# Patient Record
Sex: Female | Born: 1950 | Race: Black or African American | Hispanic: No | Marital: Married | Smoking: Never smoker
Health system: Southern US, Community
[De-identification: ages and names within clinical notes are randomized; demographics above are authoritative.]

## PROBLEM LIST (undated history)

## (undated) DIAGNOSIS — R42 Dizziness and giddiness: Secondary | ICD-10-CM

## (undated) DIAGNOSIS — I10 Essential (primary) hypertension: Secondary | ICD-10-CM

## (undated) DIAGNOSIS — E0590 Thyrotoxicosis, unspecified without thyrotoxic crisis or storm: Secondary | ICD-10-CM

---

## 1998-08-04 ENCOUNTER — Encounter: Admission: RE | Admit: 1998-08-04 | Discharge: 1998-08-04 | Payer: Self-pay | Admitting: Obstetrics & Gynecology

## 1998-08-20 ENCOUNTER — Ambulatory Visit (HOSPITAL_COMMUNITY): Admission: RE | Admit: 1998-08-20 | Discharge: 1998-08-20 | Payer: Self-pay

## 1998-08-31 ENCOUNTER — Encounter: Payer: Self-pay | Admitting: Obstetrics & Gynecology

## 1998-08-31 ENCOUNTER — Ambulatory Visit (HOSPITAL_COMMUNITY): Admission: RE | Admit: 1998-08-31 | Discharge: 1998-08-31 | Payer: Self-pay | Admitting: Obstetrics & Gynecology

## 2000-08-28 ENCOUNTER — Encounter: Payer: Self-pay | Admitting: Emergency Medicine

## 2000-08-28 ENCOUNTER — Emergency Department (HOSPITAL_COMMUNITY): Admission: EM | Admit: 2000-08-28 | Discharge: 2000-08-28 | Payer: Self-pay | Admitting: Emergency Medicine

## 2000-11-03 ENCOUNTER — Encounter: Payer: Self-pay | Admitting: Family Medicine

## 2000-11-03 ENCOUNTER — Encounter: Admission: RE | Admit: 2000-11-03 | Discharge: 2000-11-03 | Payer: Self-pay | Admitting: Family Medicine

## 2000-11-22 ENCOUNTER — Other Ambulatory Visit: Admission: RE | Admit: 2000-11-22 | Discharge: 2000-11-22 | Payer: Self-pay | Admitting: Unknown Physician Specialty

## 2000-11-23 ENCOUNTER — Encounter (INDEPENDENT_AMBULATORY_CARE_PROVIDER_SITE_OTHER): Payer: Self-pay | Admitting: Specialist

## 2000-11-23 ENCOUNTER — Other Ambulatory Visit: Admission: RE | Admit: 2000-11-23 | Discharge: 2000-11-23 | Payer: Self-pay | Admitting: *Deleted

## 2003-09-18 ENCOUNTER — Encounter: Admission: RE | Admit: 2003-09-18 | Discharge: 2003-09-18 | Payer: Self-pay | Admitting: Family Medicine

## 2004-01-07 ENCOUNTER — Emergency Department (HOSPITAL_COMMUNITY): Admission: AD | Admit: 2004-01-07 | Discharge: 2004-01-07 | Payer: Self-pay | Admitting: Family Medicine

## 2005-01-15 ENCOUNTER — Emergency Department (HOSPITAL_COMMUNITY): Admission: EM | Admit: 2005-01-15 | Discharge: 2005-01-15 | Payer: Self-pay | Admitting: Emergency Medicine

## 2005-01-24 ENCOUNTER — Encounter: Admission: RE | Admit: 2005-01-24 | Discharge: 2005-01-24 | Payer: Self-pay | Admitting: Chiropractic Medicine

## 2005-06-16 ENCOUNTER — Ambulatory Visit (HOSPITAL_COMMUNITY): Admission: RE | Admit: 2005-06-16 | Discharge: 2005-06-16 | Payer: Self-pay | Admitting: Family Medicine

## 2005-06-24 ENCOUNTER — Encounter: Admission: RE | Admit: 2005-06-24 | Discharge: 2005-06-24 | Payer: Self-pay | Admitting: Sports Medicine

## 2005-08-09 ENCOUNTER — Emergency Department (HOSPITAL_COMMUNITY): Admission: EM | Admit: 2005-08-09 | Discharge: 2005-08-09 | Payer: Self-pay | Admitting: Family Medicine

## 2005-09-22 ENCOUNTER — Emergency Department (HOSPITAL_COMMUNITY): Admission: EM | Admit: 2005-09-22 | Discharge: 2005-09-22 | Payer: Self-pay | Admitting: Emergency Medicine

## 2006-01-30 ENCOUNTER — Emergency Department (HOSPITAL_COMMUNITY): Admission: EM | Admit: 2006-01-30 | Discharge: 2006-01-30 | Payer: Self-pay | Admitting: Family Medicine
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
## 2006-12-04 ENCOUNTER — Emergency Department (HOSPITAL_COMMUNITY): Admission: EM | Admit: 2006-12-04 | Discharge: 2006-12-04 | Payer: Self-pay | Admitting: Family Medicine

## 2007-08-22 ENCOUNTER — Emergency Department (HOSPITAL_COMMUNITY): Admission: EM | Admit: 2007-08-22 | Discharge: 2007-08-22 | Payer: Self-pay | Admitting: Family Medicine

## 2008-06-22 ENCOUNTER — Emergency Department (HOSPITAL_COMMUNITY): Admission: EM | Admit: 2008-06-22 | Discharge: 2008-06-22 | Payer: Self-pay | Admitting: Emergency Medicine

## 2010-05-26 ENCOUNTER — Emergency Department (HOSPITAL_COMMUNITY): Admission: EM | Admit: 2010-05-26 | Discharge: 2010-05-27 | Payer: Self-pay | Admitting: Emergency Medicine

## 2011-01-22 LAB — POCT I-STAT, CHEM 8
BUN: 11 mg/dL (ref 6–23)
Calcium, Ion: 1.14 mmol/L (ref 1.12–1.32)
Creatinine, Ser: 0.9 mg/dL (ref 0.4–1.2)
Hemoglobin: 16.3 g/dL — ABNORMAL HIGH (ref 12.0–15.0)
TCO2: 29 mmol/L (ref 0–100)

## 2012-04-05 ENCOUNTER — Emergency Department (HOSPITAL_COMMUNITY)
Admission: EM | Admit: 2012-04-05 | Discharge: 2012-04-06 | Disposition: A | Discharge status: Home / Self Care | Payer: BC Managed Care – PPO | Attending: Emergency Medicine | Admitting: Emergency Medicine

## 2012-04-05 ENCOUNTER — Encounter (HOSPITAL_COMMUNITY): Payer: Self-pay | Admitting: Emergency Medicine

## 2012-04-05 DIAGNOSIS — E876 Hypokalemia: Secondary | ICD-10-CM

## 2012-04-05 DIAGNOSIS — E0590 Thyrotoxicosis, unspecified without thyrotoxic crisis or storm: Secondary | ICD-10-CM | POA: Insufficient documentation

## 2012-04-05 DIAGNOSIS — Z79899 Other long term (current) drug therapy: Secondary | ICD-10-CM | POA: Insufficient documentation

## 2012-04-05 DIAGNOSIS — I10 Essential (primary) hypertension: Secondary | ICD-10-CM | POA: Insufficient documentation

## 2012-04-05 DIAGNOSIS — R002 Palpitations: Secondary | ICD-10-CM

## 2012-04-05 DIAGNOSIS — R350 Frequency of micturition: Secondary | ICD-10-CM | POA: Insufficient documentation

## 2012-04-05 HISTORY — DX: Essential (primary) hypertension: I10

## 2012-04-05 HISTORY — DX: Thyrotoxicosis, unspecified without thyrotoxic crisis or storm: E05.90

## 2012-04-05 LAB — BASIC METABOLIC PANEL
BUN: 9 mg/dL (ref 6–23)
CO2: 30 mEq/L (ref 19–32)
GFR calc Af Amer: >90 mL/min (ref >90)
GFR calc non Af Amer: 90 mL/min — ABNORMAL LOW (ref >90)
Glucose, Bld: 108 mg/dL — ABNORMAL HIGH (ref 70–99)
Potassium: 3.3 mEq/L — ABNORMAL LOW (ref 3.5–5.1)

## 2012-04-05 LAB — DIFFERENTIAL
Basophils Absolute: 0 10*3/uL (ref 0.0–0.1)
Basophils Relative: 0 % (ref 0–1)
Lymphocytes Relative: 24 % (ref 12–46)
Monocytes Absolute: 0.5 10*3/uL (ref 0.1–1.0)
Neutro Abs: 4.3 10*3/uL (ref 1.7–7.7)
Neutrophils Relative %: 65 % (ref 43–77)

## 2012-04-05 LAB — URINALYSIS, ROUTINE W REFLEX MICROSCOPIC
Glucose, UA: NEGATIVE mg/dL
Ketones, ur: NEGATIVE mg/dL
Protein, ur: NEGATIVE mg/dL
Urobilinogen, UA: 0.2 mg/dL (ref 0.0–1.0)

## 2012-04-05 LAB — URINE MICROSCOPIC-ADD ON

## 2012-04-05 LAB — CBC
MCHC: 34 g/dL (ref 30.0–36.0)
Platelets: 241 10*3/uL (ref 150–400)
RDW: 14 % (ref 11.5–15.5)

## 2012-04-05 NOTE — ED Notes (Signed)
PT. REPORTS " FELT FLUSHED AND SHAKY THIS EVENING " WALKED TO THE FIRE DEPT. BLOOD PRESSURE = 160 / 130 , DENIES CHEST PAIN OR SOB , AMBULATORY , DENIES NAUSEA OR VOMITTING .

## 2012-04-05 NOTE — ED Notes (Signed)
Patient states she was out to dinner this evening, not feeling well during it.  Patient states she was feeling "not herself, not right".  They went to Fire department, BP was elevated.  Patient is CAOx3 at this time, denies any pain, shortness of breath, nausea/vomiting, dizziness.

## 2012-04-05 NOTE — ED Notes (Signed)
Family at bedside. 

## 2012-04-06 MED ORDER — POTASSIUM CHLORIDE CRYS ER 20 MEQ PO TBCR
20.0000 meq | EXTENDED_RELEASE_TABLET | Freq: Once | ORAL | Status: AC
  Administered 2012-04-06: 20 meq via ORAL
  Filled 2012-04-06: qty 1

## 2012-04-06 NOTE — Discharge Instructions (Signed)
Palpitations  Rest and be sure to drink plenty of fluids. Continue to take medications as prescribed. Have your doctor recheck her blood pressure and followup on your thyroid tests that were drawn in the emergency department today. Test is called TSH.   A palpitation is the feeling that your heartbeat is irregular or is faster than normal. Although this is frightening, it usually is not serious. Palpitations may be caused by excesses of smoking, caffeine, or alcohol. They are also brought on by stress and anxiety. Sometimes, they are caused by heart disease. Unless otherwise noted, your caregiver did not find any signs of serious illness at this time.  HOME CARE INSTRUCTIONS  To help prevent palpitations:  Drink decaffeinated coffee, tea, and soda pop. Avoid chocolate.  If you smoke or drink alcohol, quit or cut down as much as possible.  Reduce your stress or anxiety level. Biofeedback, yoga, or meditation will help you relax. Physical activity such as swimming, jogging, or walking also may be helpful.  SEEK MEDICAL CARE IF:  You continue to have a fast heartbeat.  Your palpitations occur more often.  SEEK IMMEDIATE MEDICAL CARE IF:  You develop chest pain, shortness of breath, severe headache, dizziness, or fainting.

## 2012-04-06 NOTE — ED Provider Notes (Signed)
History     CSN: [id number]  Arrival date & time 04/05/12  2207   First MD Initiated Contact with Patient 04/05/12 2257      Chief Complaint  Patient presents with  . Hypertension    (Consider location/radiation/quality/duration/timing/severity/associated sxs/prior treatment) HPI History provided by patient. Tonight around 8:30 began feeling flushed with palpitations and not feeling right. She just finished dinner with her family. She went home had a bowel movement and symptoms persisted so she went to the fire station and had her blood pressure checked. She reports it was 160/130. She takes all medications as prescribed including her blood pressure medications and her Synthroid. Of note she has not had her thyroid checked in at least a year, followed by [facility] family practice. Currently she is feeling better. She has had some urinary frequency but no dysuria or abdominal pain. She denies any recent cough cold congestion or recent illness otherwise. No chest pain or shortness of breath. She did see her physician this week in clinic and has been referred to cardiology concerns over her EKG. Father had heart disease in his 59s patient denies any history of same. No fevers or chills. No recent travel. No change in medications. No sick contacts in the household. No rashes. No tick exposures. No headache or neck stiffness. Past Medical History  Diagnosis Date  . Hypertension   . Hyperthyroidism     History reviewed. No pertinent past surgical history.  No family history on file.  History  Substance Use Topics  . Smoking status: Never Smoker   . Smokeless tobacco: Not on file  . Alcohol Use: No    OB History    Grav Para Term Preterm Abortions TAB SAB Ect Mult Living                  Review of Systems  Constitutional: Negative for fever and chills.  HENT: Negative for neck pain and neck stiffness.   Eyes: Negative for pain.  Respiratory: Negative for shortness of breath.     Cardiovascular: Positive for palpitations. Negative for chest pain.  Gastrointestinal: Negative for abdominal pain.  Genitourinary: Negative for dysuria.  Musculoskeletal: Negative for back pain.  Skin: Negative for rash.  Neurological: Negative for headaches.  All other systems reviewed and are negative.    Allergies  Review of patient's allergies indicates no known allergies.  Home Medications   Current Outpatient Rx  Name Route Sig Dispense Refill  . ACETAMINOPHEN 500 MG PO TABS Oral Take 500 mg by mouth every 6 (six) hours as needed. For pain    . AMLODIPINE BESY-BENAZEPRIL HCL 10-40 MG PO CAPS Oral Take 1 capsule by mouth daily.    Marland Kitchen VITAMIN B-12 CR PO Oral Take 1 tablet by mouth daily.    . L-METHYLFOLATE-B12-B6-B2 04-07-49-5 MG PO TABS Oral Take 1 tablet by mouth daily.    Marland Kitchen LEVOTHYROXINE SODIUM 125 MCG PO TABS Oral Take 125 mcg by mouth daily.    Marland Kitchen METOPROLOL TARTRATE 50 MG PO TABS Oral Take 25 mg by mouth 2 (two) times daily.    Marland Kitchen NAPROXEN SODIUM 220 MG PO TABS Oral Take 220 mg by mouth 2 (two) times daily with a meal. For pain    . SIMVASTATIN 40 MG PO TABS Oral Take 40 mg by mouth every evening.      BP 140/81  Pulse 88  Resp 16  SpO2 100%  Physical Exam  Constitutional: She is oriented to person, place, and time. She  appears well-developed and well-nourished.  HENT:  Head: Normocephalic and atraumatic.  Eyes: Conjunctivae and EOM are normal. Pupils are equal, round, and reactive to light.  Neck: Trachea normal. Neck supple. No thyromegaly present.  Cardiovascular: Normal rate, regular rhythm, S1 normal, S2 normal and normal pulses.     No systolic murmur is present   No diastolic murmur is present  Pulses:      Radial pulses are 2+ on the right side, and 2+ on the left side.  Pulmonary/Chest: Effort normal and breath sounds normal. She has no wheezes. She has no rhonchi. She has no rales. She exhibits no tenderness.  Abdominal: Soft. Normal appearance and  bowel sounds are normal. There is no tenderness. There is no CVA tenderness and negative Murphy's sign.  Musculoskeletal:       BLE:s Calves nontender, no cords or erythema, negative Homans sign  Neurological: She is alert and oriented to person, place, and time. She has normal strength. No cranial nerve deficit or sensory deficit. GCS eye subscore is 4. GCS verbal subscore is 5. GCS motor subscore is 6.  Skin: Skin is warm and dry. No rash noted. She is not diaphoretic.  Psychiatric: Her speech is normal.       Cooperative and appropriate    ED Course  Procedures (including critical care time)  Labs Reviewed  BASIC METABOLIC PANEL - Abnormal; Notable for the following:    Potassium 3.3 (*)    Glucose, Bld 108 (*)    GFR calc non Af Amer 90 (*)    All other components within normal limits  URINALYSIS, ROUTINE W REFLEX MICROSCOPIC - Abnormal; Notable for the following:    Hgb urine dipstick MODERATE (*)    All other components within normal limits  CBC  DIFFERENTIAL  URINE MICROSCOPIC-ADD ON  TSH    Date: 04/06/2012  Rate: 90  Rhythm: normal sinus rhythm  QRS Axis: normal  Intervals: normal  ST/T Wave abnormalities: nonspecific ST changes  Conduction Disutrbances:none  Narrative Interpretation: Some inferior ST depression unchanged from previous EKG dated 05/27/2010  Old EKG Reviewed: unchanged  IV fluids provided. Blood pressure normalizing in the ED. Labs and UA obtained and reviewed as above. Screening EKG as above unchanged from previous  TSH drawn. Patient agrees to have her physician followup with the results of this test and she understands the results will not be back tonight. MDM   Palpitations and elevated blood pressure in a patient with history of hypertension and thyroid disease. States she is compliant with thyroid medications. TSH sent as above and pending. Plan primary care followup for results. Mild hypokalemia treated with potassium. Patient feels well no ED  with no deficits and is stable for discharge home. She agrees to strict return precautions and close followup with her physician.        Sunnie Nielsen, MD 04/06/12 [phone]

## 2013-01-17 ENCOUNTER — Other Ambulatory Visit: Payer: Self-pay | Admitting: Internal Medicine

## 2013-01-17 DIAGNOSIS — Z1231 Encounter for screening mammogram for malignant neoplasm of breast: Secondary | ICD-10-CM

## 2013-03-15 ENCOUNTER — Ambulatory Visit
Admission: RE | Admit: 2013-03-15 | Discharge: 2013-03-15 | Disposition: A | Discharge status: Home / Self Care | Payer: BC Managed Care – PPO | Source: Ambulatory Visit | Attending: Internal Medicine | Admitting: Internal Medicine

## 2013-03-15 DIAGNOSIS — Z1231 Encounter for screening mammogram for malignant neoplasm of breast: Secondary | ICD-10-CM

## 2013-03-27 ENCOUNTER — Other Ambulatory Visit: Payer: Self-pay | Admitting: Internal Medicine

## 2013-03-27 DIAGNOSIS — R928 Other abnormal and inconclusive findings on diagnostic imaging of breast: Secondary | ICD-10-CM

## 2013-04-11 ENCOUNTER — Ambulatory Visit
Admission: RE | Admit: 2013-04-11 | Discharge: 2013-04-11 | Disposition: A | Discharge status: Home / Self Care | Payer: BC Managed Care – PPO | Source: Ambulatory Visit | Attending: Internal Medicine | Admitting: Internal Medicine

## 2013-04-11 DIAGNOSIS — R928 Other abnormal and inconclusive findings on diagnostic imaging of breast: Secondary | ICD-10-CM

## 2014-06-02 ENCOUNTER — Other Ambulatory Visit (HOSPITAL_COMMUNITY): Payer: Self-pay | Admitting: Internal Medicine

## 2014-06-02 DIAGNOSIS — Z1231 Encounter for screening mammogram for malignant neoplasm of breast: Secondary | ICD-10-CM

## 2014-06-11 ENCOUNTER — Ambulatory Visit (HOSPITAL_COMMUNITY)
Admission: RE | Admit: 2014-06-11 | Discharge: 2014-06-11 | Disposition: A | Discharge status: Home / Self Care | Payer: BC Managed Care – PPO | Source: Ambulatory Visit | Attending: Internal Medicine | Admitting: Internal Medicine

## 2014-06-11 DIAGNOSIS — Z1231 Encounter for screening mammogram for malignant neoplasm of breast: Secondary | ICD-10-CM

## 2014-09-22 ENCOUNTER — Ambulatory Visit: Payer: BC Managed Care – PPO | Admitting: Family

## 2014-09-22 ENCOUNTER — Other Ambulatory Visit: Payer: BC Managed Care – PPO | Admitting: Lab

## 2014-09-22 ENCOUNTER — Ambulatory Visit: Payer: BC Managed Care – PPO

## 2014-09-23 ENCOUNTER — Telehealth: Payer: Self-pay | Admitting: Hematology & Oncology

## 2014-09-23 NOTE — Telephone Encounter (Signed)
Left vm w NEW PATIENT today to remind them of their appointment with Dr. Ennever. Also, advised them to bring all medication bottles and insurance card information. ° °

## 2014-09-24 ENCOUNTER — Ambulatory Visit (HOSPITAL_BASED_OUTPATIENT_CLINIC_OR_DEPARTMENT_OTHER): Payer: 59 | Admitting: Family

## 2014-09-24 ENCOUNTER — Other Ambulatory Visit (HOSPITAL_BASED_OUTPATIENT_CLINIC_OR_DEPARTMENT_OTHER): Payer: 59 | Admitting: Lab

## 2014-09-24 ENCOUNTER — Ambulatory Visit: Payer: 59

## 2014-09-24 ENCOUNTER — Other Ambulatory Visit: Payer: Self-pay | Admitting: Family

## 2014-09-24 ENCOUNTER — Encounter: Payer: Self-pay | Admitting: Family

## 2014-09-24 VITALS — BP 220/98 | HR 65 | Temp 97.9°F | Resp 18 | Ht 69.0 in | Wt 198.0 lb

## 2014-09-24 DIAGNOSIS — R771 Abnormality of globulin: Secondary | ICD-10-CM

## 2014-09-24 DIAGNOSIS — E039 Hypothyroidism, unspecified: Secondary | ICD-10-CM

## 2014-09-24 DIAGNOSIS — R799 Abnormal finding of blood chemistry, unspecified: Secondary | ICD-10-CM

## 2014-09-24 LAB — CBC WITH DIFFERENTIAL (CANCER CENTER ONLY)
BASO#: 0 10*3/uL (ref 0.0–0.2)
BASO%: 0.3 % (ref 0.0–2.0)
EOS%: 3.1 % (ref 0.0–7.0)
Eosinophils Absolute: 0.2 10*3/uL (ref 0.0–0.5)
HCT: 40.7 % (ref 34.8–46.6)
HGB: 13.5 g/dL (ref 11.6–15.9)
LYMPH#: 1.6 10*3/uL (ref 0.9–3.3)
LYMPH%: 23.9 % (ref 14.0–48.0)
MCH: 30.6 pg (ref 26.0–34.0)
MCHC: 33.2 g/dL (ref 32.0–36.0)
MCV: 92 fL (ref 81–101)
MONO#: 0.5 10*3/uL (ref 0.1–0.9)
MONO%: 8 % (ref 0.0–13.0)
NEUT#: 4.4 10*3/uL (ref 1.5–6.5)
NEUT%: 64.7 % (ref 39.6–80.0)
PLATELETS: 230 10*3/uL (ref 145–400)
RBC: 4.41 10*6/uL (ref 3.70–5.32)
RDW: 14.2 % (ref 11.1–15.7)
WBC: 6.8 10*3/uL (ref 3.9–10.0)

## 2014-09-24 LAB — CMP (CANCER CENTER ONLY)
ALT(SGPT): 22 U/L (ref 10–47)
AST: 23 U/L (ref 11–38)
Albumin: 3.4 g/dL (ref 3.3–5.5)
Alkaline Phosphatase: 63 U/L (ref 26–84)
BILIRUBIN TOTAL: 0.8 mg/dL (ref 0.20–1.60)
BUN, Bld: 9 mg/dL (ref 7–22)
CHLORIDE: 102 meq/L (ref 98–108)
CO2: 28 mEq/L (ref 18–33)
Calcium: 9.1 mg/dL (ref 8.0–10.3)
Creat: 0.9 mg/dl (ref 0.6–1.2)
Glucose, Bld: 90 mg/dL (ref 73–118)
Potassium: 3.5 mEq/L (ref 3.3–4.7)
SODIUM: 143 meq/L (ref 128–145)
Total Protein: 9.2 g/dL — ABNORMAL HIGH (ref 6.4–8.1)

## 2014-09-24 MED ORDER — NYSTATIN POWD: 1.0000 "application " | Freq: Two times a day (BID) | Status: DC | PRN

## 2014-09-24 NOTE — Progress Notes (Signed)
Hematology/Oncology Consultation   Name: Carrie Flores      MRN: [id number]    Location: Room/bed info not found  Date: 09/24/2014 Time:10:54 AM   REFERRING PHYSICIAN:  Andi Flores  REASON FOR CONSULT:  Abnormal SPEP   DIAGNOSIS:  Mildly elevated gamma globulin   HISTORY OF PRESENT ILLNESS:  Carrie Flores is a very pleasant 63 yo African American female. She recently had an elevated gamma globulin level at 3.1 during a routine visit with her PCP. Her M-spike was not detectable.  She is completely asymptomatic at this time.  She denies fatigue, fever, chills, n/v, cough, headache, dizziness, SOB, chest pain, palpitations, abdominal pain, constipation, diarrhea, blood in urine or stool. No bleeding or pain. She does have some yeast in her groin folds that itches. I will give her some Nystatin powder for this.  No swelling, tenderness, numbness or tingling in her extremities.  Her appetite is good and she drinks lots of water. Her weight is stable.  She has no personal or familial history of cancer, sick cell/trait, or bleeding disorders. No anemias.  Her father has had a blood clot in his leg.  She has hypothyroidism and is on Sythroid.  She has 2 daughters and had no miscarriages.  She is postmenopausal and still has all her female organs.  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  She has lived in [place], [place] for the last 40 years.   ROS: All other 10 point review of systems is negative.   PAST MEDICAL HISTORY:   Past Medical History  Diagnosis Date  . Hypertension   . Hyperthyroidism     ALLERGIES: No Known Allergies    MEDICATIONS:  Current Outpatient Prescriptions on File Prior to Visit  Medication Sig Dispense Refill  . acetaminophen (TYLENOL) 500 MG tablet Take 500 mg by mouth every 6 (six) hours as needed. For pain    . amLODipine-benazepril (LOTREL) 10-40 MG per capsule Take 1 capsule by mouth daily.    . Cyanocobalamin (VITAMIN B-12 CR  PO) Take 1 tablet by mouth daily.    Marland Kitchen l-methylfolate-b2-b6-b12 (CEREFOLIN) 04-07-49-5 MG TABS Take 1 tablet by mouth daily.    Marland Kitchen levothyroxine (SYNTHROID, LEVOTHROID) 125 MCG tablet Take 125 mcg by mouth daily.    . metoprolol (LOPRESSOR) 50 MG tablet Take 25 mg by mouth 2 (two) times daily.    . naproxen sodium (ANAPROX) 220 MG tablet Take 220 mg by mouth 2 (two) times daily with a meal. For pain     No current facility-administered medications on file prior to visit.     PAST SURGICAL HISTORY No past surgical history on file.  FAMILY HISTORY: No family history on file.  SOCIAL HISTORY:  reports that she has never smoked. She does not have any smokeless tobacco history on file. She reports that she does not drink alcohol or use illicit drugs.  PERFORMANCE STATUS: The patient's performance status is 0 - Asymptomatic  PHYSICAL EXAM: Most Recent Vital Signs: Blood pressure 220/98, pulse 65, temperature 97.9 F (36.6 C), resp. rate 18, height 5\' 9"  (1.753 m), weight 198 lb (89.812 kg). BP 220/98 mmHg  Pulse 65  Temp(Src) 97.9 F (36.6 C)  Resp 18  Ht 5\' 9"  (1.753 m)  Wt 198 lb (89.812 kg)  BMI 29.23 kg/m2  General Appearance:    Alert, cooperative, no distress, appears stated age  Head:    Normocephalic, without obvious abnormality, atraumatic  Eyes:    PERRL, conjunctiva/corneas  clear, EOM's intact, fundi    benign, both eyes        Throat:   Lips, mucosa, and tongue normal; teeth and gums normal  Neck:   Supple, symmetrical, trachea midline, no adenopathy;    thyroid:  no enlargement/tenderness/nodules; no carotid   bruit or JVD  Back:     Symmetric, no curvature, ROM normal, no CVA tenderness  Lungs:     Clear to auscultation bilaterally, respirations unlabored  Chest Wall:    No tenderness or deformity   Heart:    Regular rate and rhythm, S1 and S2 normal, no murmur, rub   or gallop     Abdomen:     Soft, non-tender, bowel sounds active all four quadrants,    no  masses, no organomegaly        Extremities:   Extremities normal, atraumatic, no cyanosis or edema  Pulses:   2+ and symmetric all extremities  Skin:   Skin color, texture, turgor normal, no rashes or lesions  Lymph nodes:   Cervical, supraclavicular, and axillary nodes normal  Neurologic:   CNII-XII intact, normal strength, sensation and reflexes    throughout   LABORATORY DATA:  Results for orders placed or performed in visit on 09/24/14 (from the past 48 hour(s))  CBC with Differential [facility]     Status: None   Collection Time: 09/24/14  9:46 AM  Result Value Ref Range   WBC 6.8 3.9 - 10.0 10e3/uL   RBC 4.41 3.70 - 5.32 10e6/uL   HGB 13.5 11.6 - 15.9 g/dL   HCT 40.940.7 81.134.8 - 91.446.6 %   MCV 92 81 - 101 fL   MCH 30.6 26.0 - 34.0 pg   MCHC 33.2 32.0 - 36.0 g/dL   RDW 78.214.2 95.611.1 - 21.315.7 %   Platelets 230 145 - 400 10e3/uL   NEUT# 4.4 1.5 - 6.5 10e3/uL   LYMPH# 1.6 0.9 - 3.3 10e3/uL   MONO# 0.5 0.1 - 0.9 10e3/uL   Eosinophils Absolute 0.2 0.0 - 0.5 10e3/uL   BASO# 0.0 0.0 - 0.2 10e3/uL   NEUT% 64.7 39.6 - 80.0 %   LYMPH% 23.9 14.0 - 48.0 %   MONO% 8.0 0.0 - 13.0 %   EOS% 3.1 0.0 - 7.0 %   BASO% 0.3 0.0 - 2.0 %  COMPREHENSIVE METABOLIC PANEL ([facility] REFLEX ONLY)     Status: Abnormal   Collection Time: 09/24/14  9:46 AM  Result Value Ref Range   Sodium 143 128 - 145 mEq/L   Potassium 3.5 3.3 - 4.7 mEq/L   Chloride 102 98 - 108 mEq/L   CO2 28 18 - 33 mEq/L   Glucose, Bld 90 73 - 118 mg/dL   BUN, Bld 9 7 - 22 mg/dL   Creat 0.9 0.6 - 1.2 mg/dl   Total Bilirubin 0.860.80 0.20 - 1.60 mg/dl   Alkaline Phosphatase 63 26 - 84 U/L   AST 23 11 - 38 U/L   ALT(SGPT) 22 10 - 47 U/L   Total Protein 9.2 (H) 6.4 - 8.1 g/dL   Albumin 3.4 3.3 - 5.5 g/dL   Calcium 9.1 8.0 - 57.810.3 mg/dL      RADIOGRAPHY: No results found.     PATHOLOGY: None  ASSESSMENT/PLAN: Carrie Flores is a very pleasant 63 yo African American female. She recently had an elevated gamma globulin level at 3.1 during  a routine visit with her PCP. Her M-spike was not detectable.  She is completely asymptomatic at this  time.  Her CBC and CMP today were normal. We will see what her SPEP shows.  I do not think at this time we need to see her back for a follow-up.  I sent a prescription for Nystatin powder to her pharmacy.  All questions were answered. She knows to call the clinic with any problems, questions or concerns. We can certainly see her in the future if she needs us to. The patient was discussed with Dr. Myna HidalgoEnnever and he is in agreement with the aforementioned.   [facility] CenterCINCINNAT[facility],Newell Wafer M

## 2014-09-26 LAB — PROTEIN ELECTROPHORESIS, SERUM
ALPHA-1-GLOBULIN: 3.3 % (ref 2.9–4.9)
ALPHA-2-GLOBULIN: 7.7 % (ref 7.1–11.8)
Albumin ELP: 43.2 % — ABNORMAL LOW (ref 55.8–66.1)
BETA 2: 5 % (ref 3.2–6.5)
Beta Globulin: 4.5 % — ABNORMAL LOW (ref 4.7–7.2)
GAMMA GLOBULIN: 36.3 % — AB (ref 11.1–18.8)
Total Protein, Serum Electrophoresis: 8.6 g/dL — ABNORMAL HIGH (ref 6.0–8.3)

## 2015-07-01 ENCOUNTER — Other Ambulatory Visit: Payer: Self-pay

## 2015-07-01 DIAGNOSIS — Z1231 Encounter for screening mammogram for malignant neoplasm of breast: Secondary | ICD-10-CM

## 2015-07-03 ENCOUNTER — Ambulatory Visit
Admission: RE | Admit: 2015-07-03 | Discharge: 2015-07-03 | Disposition: A | Discharge status: Home / Self Care | Payer: Medicare Other | Source: Ambulatory Visit

## 2015-07-03 DIAGNOSIS — Z1231 Encounter for screening mammogram for malignant neoplasm of breast: Secondary | ICD-10-CM

## 2015-12-23 ENCOUNTER — Encounter (HOSPITAL_COMMUNITY): Payer: Self-pay | Admitting: Emergency Medicine

## 2015-12-23 ENCOUNTER — Other Ambulatory Visit: Payer: Self-pay

## 2015-12-23 ENCOUNTER — Emergency Department (HOSPITAL_COMMUNITY): Payer: Medicare Other

## 2015-12-23 ENCOUNTER — Emergency Department (HOSPITAL_COMMUNITY)
Admission: EM | Admit: 2015-12-23 | Discharge: 2015-12-23 | Disposition: A | Discharge status: Home / Self Care | Payer: Medicare Other | Attending: Emergency Medicine | Admitting: Emergency Medicine

## 2015-12-23 DIAGNOSIS — Z79899 Other long term (current) drug therapy: Secondary | ICD-10-CM | POA: Insufficient documentation

## 2015-12-23 DIAGNOSIS — I10 Essential (primary) hypertension: Secondary | ICD-10-CM | POA: Diagnosis not present

## 2015-12-23 DIAGNOSIS — E0590 Thyrotoxicosis, unspecified without thyrotoxic crisis or storm: Secondary | ICD-10-CM | POA: Insufficient documentation

## 2015-12-23 DIAGNOSIS — H538 Other visual disturbances: Secondary | ICD-10-CM | POA: Insufficient documentation

## 2015-12-23 LAB — CBC
HCT: 41.7 % (ref 36.0–46.0)
HEMOGLOBIN: 14.1 g/dL (ref 12.0–15.0)
MCH: 30.9 pg (ref 26.0–34.0)
MCHC: 33.8 g/dL (ref 30.0–36.0)
MCV: 91.2 fL (ref 78.0–100.0)
Platelets: 266 10*3/uL (ref 150–400)
RBC: 4.57 MIL/uL (ref 3.87–5.11)
RDW: 14.5 % (ref 11.5–15.5)
WBC: 9 10*3/uL (ref 4.0–10.5)

## 2015-12-23 LAB — COMPREHENSIVE METABOLIC PANEL
ALBUMIN: 3.8 g/dL (ref 3.5–5.0)
ALK PHOS: 89 U/L (ref 38–126)
ALT: 12 U/L — AB (ref 14–54)
AST: 22 U/L (ref 15–41)
Anion gap: 10 (ref 5–15)
BILIRUBIN TOTAL: 0.4 mg/dL (ref 0.3–1.2)
BUN: 10 mg/dL (ref 6–20)
CALCIUM: 9.7 mg/dL (ref 8.9–10.3)
CO2: 26 mmol/L (ref 22–32)
CREATININE: 0.9 mg/dL (ref 0.44–1.00)
Chloride: 104 mmol/L (ref 101–111)
GFR calc non Af Amer: >60 mL/min (ref >60)
Glucose, Bld: 124 mg/dL — ABNORMAL HIGH (ref 65–99)
Potassium: 3.1 mmol/L — ABNORMAL LOW (ref 3.5–5.1)
Sodium: 140 mmol/L (ref 135–145)
TOTAL PROTEIN: 9.4 g/dL — AB (ref 6.5–8.1)

## 2015-12-23 LAB — I-STAT TROPONIN, ED: Troponin i, poc: 0.01 ng/mL (ref 0.00–0.08)

## 2015-12-23 LAB — DIFFERENTIAL
BASOS ABS: 0 10*3/uL (ref 0.0–0.1)
Basophils Relative: 0 %
Eosinophils Absolute: 0.4 10*3/uL (ref 0.0–0.7)
Eosinophils Relative: 4 %
LYMPHS ABS: 3.1 10*3/uL (ref 0.7–4.0)
LYMPHS PCT: 34 %
Monocytes Absolute: 0.5 10*3/uL (ref 0.1–1.0)
Monocytes Relative: 6 %
NEUTROS PCT: 55 %
Neutro Abs: 5 10*3/uL (ref 1.7–7.7)

## 2015-12-23 LAB — APTT: APTT: 30 s (ref 24–37)

## 2015-12-23 LAB — I-STAT CHEM 8, ED
BUN: 11 mg/dL (ref 6–20)
CALCIUM ION: 1.13 mmol/L (ref 1.13–1.30)
CHLORIDE: 103 mmol/L (ref 101–111)
Creatinine, Ser: 0.9 mg/dL (ref 0.44–1.00)
GLUCOSE: 119 mg/dL — AB (ref 65–99)
HCT: 47 % — ABNORMAL HIGH (ref 36.0–46.0)
Hemoglobin: 16 g/dL — ABNORMAL HIGH (ref 12.0–15.0)
Potassium: 3 mmol/L — ABNORMAL LOW (ref 3.5–5.1)
Sodium: 145 mmol/L (ref 135–145)
TCO2: 27 mmol/L (ref 0–100)

## 2015-12-23 LAB — CBG MONITORING, ED: GLUCOSE-CAPILLARY: 88 mg/dL (ref 65–99)

## 2015-12-23 LAB — PROTIME-INR
INR: 1.05 (ref 0.00–1.49)
Prothrombin Time: 13.9 seconds (ref 11.6–15.2)

## 2015-12-23 MED ORDER — FLUORESCEIN SODIUM 1 MG OP STRP
1.0000 | ORAL_STRIP | Freq: Once | OPHTHALMIC | Status: AC
  Administered 2015-12-23: 1 via OPHTHALMIC
  Filled 2015-12-23: qty 1

## 2015-12-23 MED ORDER — TETRACAINE HCL 0.5 % OP SOLN
1.0000 [drp] | Freq: Once | OPHTHALMIC | Status: AC
  Administered 2015-12-23: 1 [drp] via OPHTHALMIC
  Filled 2015-12-23: qty 2

## 2015-12-23 NOTE — ED Notes (Addendum)
Pt. woke up this evening with left eye blurred vision " looks white" , went to bed approx. 11 pm this evening , speech clear / no facial asymmetry , equal strong grips with no arm drift , pt. seen by Dr. Elesa Massed ( EDP ) at triage . Hypertensive at arrival.

## 2015-12-23 NOTE — ED Provider Notes (Signed)
00:30  I was asked to see patient in triage. Patient is a 65 y.o. female with history of hypertension who presents to the emergency department with left eye vision changes. Patient reports that she has almost no vision out of the right eye at baseline and does wear glasses. States she went to bed at 11 PM normal and woke up just prior to arrival with decreased vision in the left eye. Describes it is only be able to see white out of the left eye. States it is slowly improving and is now just blurry. There is no flashes, floaters. No eye pain. No headache. No vomiting. No numbness, tingling or focal weakness. She is hypertensive. She feels her vision is back to baseline without her glasses on. Because of this a code stroke will not be initiated. I feel that this is likely an issue coming from the eye itself but given we are unable to assess any vision changes from the right eye because of her baseline vision loss in this eye I feel is reasonable to get labs, head CT.  Layla Maw Ward, DO 12/23/15 [phone]

## 2015-12-23 NOTE — Discharge Instructions (Signed)
Blurred Vision °Having blurred vision means that you cannot see things clearly. Your vision may seem fuzzy or out of focus. Blurred vision is a very common symptom of an eye or vision problem. Blurred vision is often a gradual blur that occurs in one eye or both eyes. There are many causes of blurred vision, including cataracts, macular degeneration, and diabetic retinopathy. °Blurred vision can be diagnosed based on your symptoms and a physical exam. Tell your health care provider about any other health problems you have, any recent eye injury, and any prior surgeries. You may need to see a health care provider who specializes in eye problems (ophthalmologist). Your treatment depends on what is causing your blurred vision.  °HOME CARE INSTRUCTIONS °· Tell your health care provider about any changes in your blurred vision. °· Do not drive or operate heavy machinery if your vision is blurry. °· Keep all follow-up visits as directed by your health care provider. This is important. °SEEK MEDICAL CARE IF: °· Your symptoms get worse. °· You have new symptoms. °· You have trouble seeing at night. °· You have trouble seeing up close or far away. °· You have trouble noticing the difference between colors. °SEEK IMMEDIATE MEDICAL CARE IF: °· You have severe eye pain. °· You have a severe headache. °· You have flashing lights in your field of vision. °· You have a sudden change in vision. °· You have a sudden loss of vision. °· You have vision change after an injury. °· You notice drainage coming from your eyes. °· You notice a rash around your eyes. °  °This information is not intended to replace advice given to you by your health care provider. Make sure you discuss any questions you have with your health care provider. °  °Document Released: 10/27/2003 Document Revised: 03/10/2015 Document Reviewed: 09/17/2014 °Elsevier Interactive Patient Education ©2016 [employer] °Hypertension °Hypertension, commonly called high  blood pressure, is when the force of blood pumping through your arteries is too strong. Your arteries are the blood vessels that carry blood from your heart throughout your body. A blood pressure reading consists of a higher number over a lower number, such as 110/72. The higher number (systolic) is the pressure inside your arteries when your heart pumps. The lower number (diastolic) is the pressure inside your arteries when your heart relaxes. Ideally you want your blood pressure below 120/80. °Hypertension forces your heart to work harder to pump blood. Your arteries may become narrow or stiff. Having untreated or uncontrolled hypertension can cause heart attack, stroke, kidney disease, and other problems. °RISK FACTORS °Some risk factors for high blood pressure are controllable. Others are not.  °Risk factors you cannot control include:  °· Race. You may be at higher risk if you are African American. °· Age. Risk increases with age. °· Gender. Men are at higher risk than women before age 45 years. After age 65, women are at higher risk than men. °Risk factors you can control include: °· Not getting enough exercise or physical activity. °· Being overweight. °· Getting too much fat, sugar, calories, or salt in your diet. °· Drinking too much alcohol. °SIGNS AND SYMPTOMS °Hypertension does not usually cause signs or symptoms. Extremely high blood pressure (hypertensive crisis) may cause headache, anxiety, shortness of breath, and nosebleed. °DIAGNOSIS °To check if you have hypertension, your health care provider will measure your blood pressure while you are seated, with your arm held at the level of your heart. It should be measured   at least twice using the same arm. Certain conditions can cause a difference in blood pressure between your right and left arms. A blood pressure reading that is higher than normal on one occasion does not mean that you need treatment. If it is not clear whether you have high blood  pressure, you may be asked to return on a different day to have your blood pressure checked again. Or, you may be asked to monitor your blood pressure at home for 1 or more weeks. TREATMENT Treating high blood pressure includes making lifestyle changes and possibly taking medicine. Living a healthy lifestyle can help lower high blood pressure. You may need to change some of your habits. Lifestyle changes may include:  Following the DASH diet. This diet is high in fruits, vegetables, and whole grains. It is low in salt, red meat, and added sugars.  Keep your sodium intake below 2,300 mg per day.  Getting at least 30-45 minutes of aerobic exercise at least 4 times per week.  Losing weight if necessary.  Not smoking.  Limiting alcoholic beverages.  Learning ways to reduce stress. Your health care provider may prescribe medicine if lifestyle changes are not enough to get your blood pressure under control, and if one of the following is true:  You are 77-10 years of age and your systolic blood pressure is above 140.  You are 10 years of age or older, and your systolic blood pressure is above 150.  Your diastolic blood pressure is above 90.  You have diabetes, and your systolic blood pressure is over 140 or your diastolic blood pressure is over 90.  You have kidney disease and your blood pressure is above 140/90.  You have heart disease and your blood pressure is above 140/90. Your personal target blood pressure may vary depending on your medical conditions, your age, and other factors. HOME CARE INSTRUCTIONS  Have your blood pressure rechecked as directed by your health care provider.   Take medicines only as directed by your health care provider. Follow the directions carefully. Blood pressure medicines must be taken as prescribed. The medicine does not work as well when you skip doses. Skipping doses also puts you at risk for problems.  Do not smoke.   Monitor your blood  pressure at home as directed by your health care provider. SEEK MEDICAL CARE IF:   You think you are having a reaction to medicines taken.  You have recurrent headaches or feel dizzy.  You have swelling in your ankles.  You have trouble with your vision. SEEK IMMEDIATE MEDICAL CARE IF:  You develop a severe headache or confusion.  You have unusual weakness, numbness, or feel faint.  You have severe chest or abdominal pain.  You vomit repeatedly.  You have trouble breathing. MAKE SURE YOU:   Understand these instructions.  Will watch your condition.  Will get help right away if you are not doing well or get worse.   This information is not intended to replace advice given to you by your health care provider. Make sure you discuss any questions you have with your health care provider.   Document Released: 10/24/2005 Document Revised: 03/10/2015 Document Reviewed: 08/16/2013 Elsevier Interactive Patient Education [employer]

## 2015-12-23 NOTE — ED Provider Notes (Signed)
CSN: [id number]     Arrival date & time 12/23/15  0002 History   First MD Initiated Contact with Patient 12/23/15 [phone]     Chief Complaint  Patient presents with  . Eye Problem     (Consider location/radiation/quality/duration/timing/severity/associated sxs/prior Treatment) HPI Comments: Patient presents to the ER for evaluation of vision change. Patient reports that she awakened and noticed that she was experiencing blurry vision in her left eye. She was able to open and close each eye individually and determined that it was only the left eye involved. She did not have any flashing lights, floaters, pain. She was able to see out of the eye, vision was simply blurry. There was no double vision. Patient reports that symptoms were present for more than an hour and then have resolved. Her vision is now back to baseline. She has never had similar symptoms previously.  Patient is a 65 y.o. female presenting with eye problem.  Eye Problem   Past Medical History  Diagnosis Date  . Hypertension   . Hyperthyroidism    No past surgical history on file. No family history on file. Social History  Substance Use Topics  . Smoking status: Never Smoker   . Smokeless tobacco: None  . Alcohol Use: No   OB History    No data available     Review of Systems  Eyes: Positive for visual disturbance.  All other systems reviewed and are negative.     Allergies  Review of patient's allergies indicates no known allergies.  Home Medications   Prior to Admission medications   Medication Sig Start Date End Date Taking? Authorizing Provider  amLODipine-benazepril (LOTREL) 5-20 MG capsule Take 1 capsule by mouth daily.   Yes Historical Provider, MD  BYSTOLIC 10 MG tablet Take 10 mg by mouth 2 (two) times daily.  07/24/14  Yes Historical Provider, MD  levothyroxine (SYNTHROID, LEVOTHROID) 125 MCG tablet Take 125 mcg by mouth daily.   Yes Historical Provider, MD  Nystatin POWD Apply 1 application  topically 2 (two) times daily as needed. Patient not taking: Reported on 12/23/2015 09/24/14   Brand Males [place], NP   BP 201/115 mmHg  Pulse 93  Temp(Src) 98.3 F (36.8 C) (Oral)  Resp 20  Ht  (1.753 m)  Wt 200 lb (90.719 kg)  BMI 29.52 kg/m2  SpO2 100% Physical Exam  Constitutional: She is oriented to person, place, and time. She appears well-developed and well-nourished. No distress.  HENT:  Head: Normocephalic and atraumatic.  Right Ear: Hearing normal.  Left Ear: Hearing normal.  Nose: Nose normal.  Mouth/Throat: Oropharynx is clear and moist and mucous membranes are normal.  Eyes: Conjunctivae and EOM are normal. Pupils are equal, round, and reactive to light.  Fundoscopic exam:      The left eye shows no exudate, no hemorrhage and no papilledema.  Intraocular pressure right eye 13 Intraocular pressure left eye 4  Fluoroscein in exam: Normal, no uptake noted  Neck: Normal range of motion. Neck supple. Carotid bruit is not present.  Cardiovascular: Regular rhythm, S1 normal and S2 normal.  Exam reveals no gallop and no friction rub.   No murmur heard. Pulmonary/Chest: Effort normal and breath sounds normal. No respiratory distress. She exhibits no tenderness.  Abdominal: Soft. Normal appearance and bowel sounds are normal. There is no hepatosplenomegaly. There is no tenderness. There is no rebound, no guarding, no tenderness at McBurney's point and negative Murphy's sign. No hernia.  Musculoskeletal: Normal range of motion.  Neurological: She is alert and oriented to person, place, and time. She has normal strength. No cranial nerve deficit or sensory deficit. Coordination normal. GCS eye subscore is 4. GCS verbal subscore is 5. GCS motor subscore is 6.  Extraocular muscle movement: normal No visual field cut Pupils: equal and reactive both direct and consensual response is normal No nystagmus present    Sensory function is intact to light touch,  pinprick Proprioception intact  Grip strength 5/5 symmetric in upper extremities No pronator drift Normal finger to nose bilaterally  Lower extremity strength 5/5 against gravity Normal heel to shin bilaterally  Gait: normal   Skin: Skin is warm, dry and intact. No rash noted. No cyanosis.  Psychiatric: She has a normal mood and affect. Her speech is normal and behavior is normal. Thought content normal.  Nursing note and vitals reviewed.   ED Course  Procedures (including critical care time) Labs Review Labs Reviewed  COMPREHENSIVE METABOLIC PANEL - Abnormal; Notable for the following:    Potassium 3.1 (*)    Glucose, Bld 124 (*)    Total Protein 9.4 (*)    ALT 12 (*)    All other components within normal limits  I-STAT CHEM 8, ED - Abnormal; Notable for the following:    Potassium 3.0 (*)    Glucose, Bld 119 (*)    Hemoglobin 16.0 (*)    HCT 47.0 (*)    All other components within normal limits  PROTIME-INR  APTT  CBC  DIFFERENTIAL  I-STAT TROPOININ, ED  CBG MONITORING, ED    Imaging Review Ct Head Wo Contrast  12/23/2015  CLINICAL DATA:  Patient woke up last night with blurred vision. Sudden onset. Patient now all sees clearly. EXAM: CT HEAD WITHOUT CONTRAST TECHNIQUE: Contiguous axial images were obtained from the base of the skull through the vertex without intravenous contrast. COMPARISON:  05/27/2010 FINDINGS: Ventricles and sulci appear symmetrical. No ventricular dilatation. No mass effect or midline shift. No abnormal extra-axial fluid collections. Gray-white matter junctions are distinct. Basal cisterns are not effaced. No evidence of acute intracranial hemorrhage. Circumscribed lucent lesion in the right occipital calvarium is unchanged since previous study and likely represents benign finding. No depressed skull fractures. Opacification of mastoid air cells bilaterally. Some opacification of the tympanic cavity on the left. IMPRESSION: No acute intracranial  abnormalities. Bilateral mastoid effusions with fluid in the left tympanic cavity. Electronically Signed   By: Burman Nieves M.D.   On: 12/23/2015 01:49   I have personally reviewed and evaluated these images and lab results as part of my medical decision-making.   EKG Interpretation   Date/Time:  Wednesday December 23 2015 00:38:14 EST Ventricular Rate:  105 PR Interval:  176 QRS Duration: 84 QT Interval:  366 QTC Calculation: 483 R Axis:   25 Text Interpretation:  Sinus tachycardia Right atrial enlargement  Nonspecific ST and T wave abnormality Abnormal ECG No significant change  since last tracing Confirmed by Darby Fleeman  MD, Makeya Hilgert [phone]) on  12/23/2015 4:31:56 AM      MDM   Final diagnoses:  None   vision disturbance  Presents to the emergency department for evaluation of visual disturbance. Patient had monocular left eye blurred vision earlier which has now resolved. She did not have any other neurologic symptoms. Neurologic examination on arrival was entirely normal. Likewise, I exam was normal. She has no evidence of elevated intraocular pressure. Cornea is normal. Funduscopic exam was unremarkable. Description of symptoms is not consistent with amaurosis  fugax. Patient reassured, will need to take her blood pressure medicine when she gets home. She will call her eye doctor today and have thorough eye exam.    Gilda Crease, MD 12/23/15 [phone]

## 2016-07-01 ENCOUNTER — Other Ambulatory Visit: Payer: Self-pay | Admitting: Internal Medicine

## 2016-07-01 DIAGNOSIS — Z1231 Encounter for screening mammogram for malignant neoplasm of breast: Secondary | ICD-10-CM

## 2016-07-05 ENCOUNTER — Ambulatory Visit: Payer: Medicare Other

## 2016-07-13 ENCOUNTER — Ambulatory Visit: Payer: Medicare Other

## 2016-09-20 ENCOUNTER — Encounter (HOSPITAL_COMMUNITY): Payer: Self-pay | Admitting: Emergency Medicine

## 2016-09-20 ENCOUNTER — Ambulatory Visit (HOSPITAL_COMMUNITY)
Admission: EM | Admit: 2016-09-20 | Discharge: 2016-09-20 | Disposition: A | Discharge status: Home / Self Care | Payer: Medicare Other | Attending: Family Medicine | Admitting: Family Medicine

## 2016-09-20 DIAGNOSIS — Z7982 Long term (current) use of aspirin: Secondary | ICD-10-CM | POA: Insufficient documentation

## 2016-09-20 DIAGNOSIS — I10 Essential (primary) hypertension: Secondary | ICD-10-CM | POA: Diagnosis not present

## 2016-09-20 DIAGNOSIS — R42 Dizziness and giddiness: Secondary | ICD-10-CM

## 2016-09-20 DIAGNOSIS — Z79899 Other long term (current) drug therapy: Secondary | ICD-10-CM | POA: Insufficient documentation

## 2016-09-20 HISTORY — DX: Dizziness and giddiness: R42

## 2016-09-20 LAB — CBC
HCT: 42.1 % (ref 36.0–46.0)
HEMOGLOBIN: 14.4 g/dL (ref 12.0–15.0)
MCH: 31.4 pg (ref 26.0–34.0)
MCHC: 34.2 g/dL (ref 30.0–36.0)
MCV: 91.7 fL (ref 78.0–100.0)
Platelets: 258 10*3/uL (ref 150–400)
RBC: 4.59 MIL/uL (ref 3.87–5.11)
RDW: 14.2 % (ref 11.5–15.5)
WBC: 10.1 10*3/uL (ref 4.0–10.5)

## 2016-09-20 LAB — BASIC METABOLIC PANEL
ANION GAP: 7 (ref 5–15)
BUN: 7 mg/dL (ref 6–20)
CALCIUM: 9.8 mg/dL (ref 8.9–10.3)
CO2: 28 mmol/L (ref 22–32)
Chloride: 102 mmol/L (ref 101–111)
Creatinine, Ser: 0.9 mg/dL (ref 0.44–1.00)
Glucose, Bld: 110 mg/dL — ABNORMAL HIGH (ref 65–99)
Potassium: 2.8 mmol/L — ABNORMAL LOW (ref 3.5–5.1)
Sodium: 137 mmol/L (ref 135–145)

## 2016-09-20 LAB — URINE MICROSCOPIC-ADD ON

## 2016-09-20 LAB — URINALYSIS, ROUTINE W REFLEX MICROSCOPIC
BILIRUBIN URINE: NEGATIVE
Glucose, UA: NEGATIVE mg/dL
Ketones, ur: NEGATIVE mg/dL
Leukocytes, UA: NEGATIVE
NITRITE: NEGATIVE
Protein, ur: NEGATIVE mg/dL
SPECIFIC GRAVITY, URINE: 1.009 (ref 1.005–1.030)
pH: 7.5 (ref 5.0–8.0)

## 2016-09-20 NOTE — ED Triage Notes (Signed)
Pt reports dizziness intermittently since waking this morning at 0737, states worse tonight at 1730. Seen at urgent care and sent here for further eval. Pt reports hx vertigo "for years." Report L sided headache. No neuro deficits in triage.

## 2016-09-20 NOTE — ED Triage Notes (Signed)
[facility] a [facility]. [facility]. The patient reported a hx of vertigo.

## 2016-09-20 NOTE — ED Provider Notes (Signed)
CSN: [id number]     Arrival date & time 09/20/16  1907 History   None    Chief Complaint  Patient presents with  . Dizziness   (Consider location/radiation/quality/duration/timing/severity/associated sxs/prior Treatment) Patient is c/o severe vertigo and cannot ambulate.  She is also c/o phonophotophobia and headache.   The history is provided by the patient.  Dizziness  Quality:  Head spinning, imbalance, lightheadedness, room spinning and vertigo Severity:  Severe Onset quality:  Sudden Duration:  2 hours Progression:  Worsening Chronicity:  New Context: bending over   Relieved by:  Nothing Worsened by:  Nothing Ineffective treatments:  None tried Associated symptoms: headaches   Risk factors: multiple medications     Past Medical History:  Diagnosis Date  . Hypertension   . Hyperthyroidism   . Vertigo    History reviewed. No pertinent surgical history. History reviewed. No pertinent family history. Social History  Substance Use Topics  . Smoking status: Never Smoker  . Smokeless tobacco: Never Used  . Alcohol use No   OB History    No data available     Review of Systems  Constitutional: Negative.   HENT: Negative.   Eyes: Negative.   Respiratory: Negative.   Cardiovascular: Negative.   Gastrointestinal: Negative.   Endocrine: Negative.   Genitourinary: Negative.   Musculoskeletal: Negative.   Skin: Negative.   Allergic/Immunologic: Negative.   Neurological: Positive for dizziness, light-headedness and headaches.  Hematological: Negative.   Psychiatric/Behavioral: Negative.     Allergies  Patient has no known allergies.  Home Medications   Prior to Admission medications   Medication Sig Start Date End Date Taking? Authorizing Provider  amLODipine-benazepril (LOTREL) 5-20 MG capsule Take 1 capsule by mouth daily.   Yes Historical Provider, MD  BYSTOLIC 10 MG tablet Take 10 mg by mouth 2 (two) times daily.  07/24/14  Yes Historical Provider, MD   levothyroxine (SYNTHROID, LEVOTHROID) 125 MCG tablet Take 125 mcg by mouth daily.   Yes Historical Provider, MD  Nystatin POWD Apply 1 application topically 2 (two) times daily as needed. Patient not taking: Reported on 12/23/2015 09/24/14   Verdie MosherSarah M [place], NP   Meds Ordered and Administered this Visit  Medications - No data to display  BP 154/95 (BP Location: Left Arm)   Pulse 72   Temp 98.6 F (37 C) (Oral)   Resp 16   SpO2 99%  No data found.   Physical Exam  Constitutional: She is oriented to person, place, and time. She appears well-developed and well-nourished.  HENT:  Head: Normocephalic and atraumatic.  Right Ear: External ear normal.  Left Ear: External ear normal.  Mouth/Throat: Oropharynx is clear and moist.  Eyes: Conjunctivae and EOM are normal. Pupils are equal, round, and reactive to light.  Neck: Normal range of motion. Neck supple.  Cardiovascular: Normal rate, regular rhythm, normal heart sounds and intact distal pulses.   Pulmonary/Chest: Effort normal and breath sounds normal.  Abdominal: Soft. Bowel sounds are normal.  Musculoskeletal: Normal range of motion.  Neurological: She is alert and oriented to person, place, and time.  Nursing note and vitals reviewed.   Urgent Care Course   Clinical Course     Procedures (including critical care time)  Labs Review Labs Reviewed - No data to display  Imaging Review No results found.   Visual Acuity Review  Right Eye Distance:   Left Eye Distance:   Bilateral Distance:    Right Eye Near:   Left Eye Near:  Bilateral Near:         MDM   1. Vertigo   2. Head ache 3. Nausea  Patient advised to be transferred to ED to r/o tia or cva and transfer to higher level of care.    Deatra CanterWilliam J [place], FNP 09/20/16 2033

## 2016-09-21 ENCOUNTER — Emergency Department (HOSPITAL_COMMUNITY)
Admission: EM | Admit: 2016-09-21 | Discharge: 2016-09-21 | Disposition: A | Discharge status: Home / Self Care | Payer: Medicare Other | Attending: Emergency Medicine | Admitting: Emergency Medicine

## 2016-09-21 DIAGNOSIS — R42 Dizziness and giddiness: Secondary | ICD-10-CM

## 2016-09-21 LAB — CBG MONITORING, ED: GLUCOSE-CAPILLARY: 113 mg/dL — AB (ref 65–99)

## 2016-09-21 MED ORDER — DIAZEPAM 5 MG/ML IJ SOLN
2.5000 mg | Freq: Once | INTRAMUSCULAR | Status: AC
  Administered 2016-09-21: 2.5 mg via INTRAVENOUS
  Filled 2016-09-21: qty 2

## 2016-09-21 MED ORDER — MECLIZINE HCL 25 MG PO TABS
12.5000 mg | ORAL_TABLET | Freq: Once | ORAL | Status: AC
  Administered 2016-09-21: 12.5 mg via ORAL
  Filled 2016-09-21: qty 1

## 2016-09-21 MED ORDER — MECLIZINE HCL 25 MG PO TABS: 25.0000 mg | ORAL_TABLET | Freq: Three times a day (TID) | ORAL | 0 refills | Status: AC | PRN

## 2016-09-21 MED ORDER — POTASSIUM CHLORIDE CRYS ER 20 MEQ PO TBCR
40.0000 meq | EXTENDED_RELEASE_TABLET | Freq: Once | ORAL | Status: AC
  Administered 2016-09-21: 40 meq via ORAL
  Filled 2016-09-21: qty 2

## 2016-09-21 MED ORDER — SODIUM CHLORIDE 0.9 % IV BOLUS (SEPSIS)
500.0000 mL | Freq: Once | INTRAVENOUS | Status: AC
  Administered 2016-09-21: 500 mL via INTRAVENOUS

## 2016-09-21 NOTE — ED Provider Notes (Signed)
[facility]-EMERGENCY DEPT Provider Note   CSN: [id number] Arrival date & time: 09/20/16  2041  By signing my name below, I, Alyssa Grove, attest that this documentation has been prepared under the direction and in the presence of Gilda Crease, MD. Electronically Signed: Alyssa Grove, ED Scribe. 09/21/16. 1:19 AM.   History   Chief Complaint Chief Complaint  Patient presents with  . Dizziness   The history is provided by the patient. No language interpreter was used.   HPI Comments: Carrie Flores is a 65 y.o. female with PMHx of Vertigo, HTN, and Hyperthyroidism who presents to the Emergency Department complaining of gradual onset dizziness since waking this morning. She notes that dizziness was exacerbated after eating around 5:30 PM. She has the sensation that the world is spinning around her. Sensation is similar to vertigo 6-7 years ago. She reports associated left sided headache. Denies decreased hearing, ear pain, chest pain, heart palpitations,  Past Medical History:  Diagnosis Date  . Hypertension   . Hyperthyroidism   . Vertigo     There are no active problems to display for this patient.   History reviewed. No pertinent surgical history.  OB History    No data available       Home Medications    Prior to Admission medications   Medication Sig Start Date End Date Taking? Authorizing Provider  aspirin EC 81 MG tablet Take 81 mg by mouth daily.   Yes Historical Provider, MD  BYSTOLIC 10 MG tablet Take 20 mg by mouth every evening.  07/24/14  Yes Historical Provider, MD  cholecalciferol (VITAMIN D) 1000 units tablet Take 1,000 Units by mouth daily.   Yes Historical Provider, MD  ibuprofen (ADVIL,MOTRIN) 800 MG tablet Take 800 mg by mouth every 8 (eight) hours as needed for moderate pain.   Yes Historical Provider, MD  levothyroxine (SYNTHROID, LEVOTHROID) 125 MCG tablet Take 125 mcg by mouth daily.   Yes Historical Provider, MD  Multiple Vitamin (MULTIVITAMIN  WITH MINERALS) TABS tablet Take 1 tablet by mouth daily.   Yes Historical Provider, MD  Olmesartan-Amlodipine-HCTZ 40-5-25 MG TABS Take 1 tablet by mouth every morning.   Yes Historical Provider, MD  vitamin B-12 (CYANOCOBALAMIN) 1000 MCG tablet Take 500 mcg by mouth daily.   Yes Historical Provider, MD  meclizine (ANTIVERT) 25 MG tablet Take 1 tablet (25 mg total) by mouth 3 (three) times daily as needed for dizziness. 09/21/16   Gilda Crease, MD    Family History History reviewed. No pertinent family history.  Social History Social History  Substance Use Topics  . Smoking status: Never Smoker  . Smokeless tobacco: Never Used  . Alcohol use No     Allergies   Patient has no known allergies.   Review of Systems Review of Systems  Constitutional: Negative for fever.  HENT: Negative for ear pain and hearing loss.   Cardiovascular: Negative for chest pain and palpitations.  Neurological: Positive for dizziness and headaches.  All other systems reviewed and are negative.   Physical Exam Updated Vital Signs BP (!) 198/120 (BP Location: Left Arm)   Pulse 77   Temp 97.9 F (36.6 C) (Oral)   Resp 18   Ht 5\' 9"  (1.753 m)   Wt 200 lb (90.7 kg)   SpO2 100%   BMI 29.53 kg/m   Physical Exam  Constitutional: She is oriented to person, place, and time. She appears well-developed and well-nourished. No distress.  HENT:  Head: Normocephalic and atraumatic.  Right Ear: Hearing normal.  Left Ear: Hearing normal.  Nose: Nose normal.  Mouth/Throat: Oropharynx is clear and moist and mucous membranes are normal.  Eyes: Conjunctivae and EOM are normal. Pupils are equal, round, and reactive to light.  Neck: Normal range of motion. Neck supple.  Cardiovascular: Regular rhythm, S1 normal and S2 normal.  Exam reveals no gallop and no friction rub.   No murmur heard. Pulmonary/Chest: Effort normal and breath sounds normal. No respiratory distress. She exhibits no tenderness.    Abdominal: Soft. Normal appearance and bowel sounds are normal. There is no hepatosplenomegaly. There is no tenderness. There is no rebound, no guarding, no tenderness at McBurney's point and negative Murphy's sign. No hernia.  Musculoskeletal: Normal range of motion.  Neurological: She is alert and oriented to person, place, and time. She has normal strength. No cranial nerve deficit or sensory deficit. Coordination normal. GCS eye subscore is 4. GCS verbal subscore is 5. GCS motor subscore is 6.  Skin: Skin is warm, dry and intact. No rash noted. No cyanosis.  Psychiatric: She has a normal mood and affect. Her speech is normal and behavior is normal. Thought content normal.  Nursing note and vitals reviewed.  ED Treatments / Results  DIAGNOSTIC STUDIES: Oxygen Saturation is 100% on RA, normal by my interpretation.    COORDINATION OF CARE: 1:16 AM Discussed treatment plan with pt at bedside which includes Valium and Antivert and pt agreed to plan.  Labs (all labs ordered are listed, but only abnormal results are displayed) Labs Reviewed  BASIC METABOLIC PANEL - Abnormal; Notable for the following:       Result Value   Potassium 2.8 (*)    Glucose, Bld 110 (*)    All other components within normal limits  URINALYSIS, ROUTINE W REFLEX MICROSCOPIC (NOT AT [facility]) - Abnormal; Notable for the following:    Color, Urine STRAW (*)    Hgb urine dipstick SMALL (*)    All other components within normal limits  URINE MICROSCOPIC-ADD ON - Abnormal; Notable for the following:    Squamous Epithelial / LPF 0-5 (*)    Bacteria, UA FEW (*)    All other components within normal limits  CBG MONITORING, ED - Abnormal; Notable for the following:    Glucose-Capillary 113 (*)    All other components within normal limits  CBC    EKG  EKG Interpretation  Date/Time:  Tuesday September 20 2016 20:52:39 EST Ventricular Rate:  73 PR Interval:  176 QRS Duration: 92 QT Interval:  392 QTC  Calculation: 431 R Axis:   11 Text Interpretation:  Normal sinus rhythm Cannot rule out Anterior infarct , age undetermined Abnormal ECG No significant change since last tracing Confirmed by POLLINA  MD, CHRISTOPHER [phone]([id number]) on 09/21/2016 1:07:38 AM       Radiology No results found.  Procedures Procedures (including critical care time)  Medications Ordered in ED Medications  potassium chloride SA (K-DUR,KLOR-CON) CR tablet 40 mEq (40 mEq Oral Given 09/21/16 0137)  sodium chloride 0.9 % bolus 500 mL (0 mLs Intravenous Stopped 09/21/16 0300)  meclizine (ANTIVERT) tablet 12.5 mg (12.5 mg Oral Given 09/21/16 0137)  diazepam (VALIUM) injection 2.5 mg (2.5 mg Intravenous Given 09/21/16 0136)     Initial Impression / Assessment and Plan / ED Course  I have reviewed the triage vital signs and the nursing notes.  Pertinent labs & imaging results that were available during my care of the patient were reviewed by me and considered  in my medical decision making (see chart for details).  Clinical Course   Patient presents to the ER with complaints of dizziness. Patient reports vertiginous type dizziness symptoms which began this evening. She does have a history of vertigo with similar symptoms, but has not had symptoms for about 6 years. Patient denies ringing in her ears and hearing loss. There is no significant headache. Patient denies injury. She has not noticed any numbness, tingling, weakness of any extremities. Her neurologic exam is entirely normal. Blood work normal. EKG unremarkable. Patient administered low-dose meclizine and Valium with significant improvement. Based on her history of vertigo, improvement with this treatment and normal neurologic exam, I do not feel the patient requires imaging. She'll be discharged with symptomatic treatment, follow-up with PCP.  I personally performed the services described in this documentation, which was scribed in my presence. The recorded information  has been reviewed and is accurate.  Final Clinical Impressions(s) / ED Diagnoses   Final diagnoses:  Vertigo    New Prescriptions Discharge Medication List as of 09/21/2016  3:22 AM    START taking these medications   Details  meclizine (ANTIVERT) 25 MG tablet Take 1 tablet (25 mg total) by mouth 3 (three) times daily as needed for dizziness., Starting Wed 09/21/2016, Print         Gilda Creasehristopher J Pollina, MD 09/22/16 [phone]

## 2017-09-20 ENCOUNTER — Other Ambulatory Visit: Payer: Self-pay | Admitting: Internal Medicine

## 2017-09-20 DIAGNOSIS — Z139 Encounter for screening, unspecified: Secondary | ICD-10-CM

## 2017-10-03 ENCOUNTER — Other Ambulatory Visit: Payer: Self-pay | Admitting: Internal Medicine

## 2017-10-03 DIAGNOSIS — E2839 Other primary ovarian failure: Secondary | ICD-10-CM

## 2017-10-19 ENCOUNTER — Ambulatory Visit
Admission: RE | Admit: 2017-10-19 | Discharge: 2017-10-19 | Disposition: A | Discharge status: Home / Self Care | Payer: Medicare Other | Source: Ambulatory Visit | Attending: Internal Medicine | Admitting: Internal Medicine

## 2017-10-19 DIAGNOSIS — Z139 Encounter for screening, unspecified: Secondary | ICD-10-CM

## 2017-11-06 ENCOUNTER — Ambulatory Visit
Admission: RE | Admit: 2017-11-06 | Discharge: 2017-11-06 | Disposition: A | Discharge status: Home / Self Care | Payer: Medicare Other | Source: Ambulatory Visit | Attending: Internal Medicine | Admitting: Internal Medicine

## 2017-11-06 DIAGNOSIS — E2839 Other primary ovarian failure: Secondary | ICD-10-CM

## 2019-08-31 ENCOUNTER — Encounter (INDEPENDENT_AMBULATORY_CARE_PROVIDER_SITE_OTHER): Payer: Self-pay

## 2019-12-29 ENCOUNTER — Ambulatory Visit: Payer: Medicare Other | Attending: Internal Medicine

## 2019-12-29 DIAGNOSIS — Z23 Encounter for immunization: Secondary | ICD-10-CM | POA: Insufficient documentation

## 2019-12-29 NOTE — Progress Notes (Signed)
   Covid-19 Vaccination Clinic  Name:  Carrie Flores    MRN: [id number] DOB: 20-Dec-1950  12/29/2019  Carrie Flores was observed post Covid-19 immunization for 15 minutes without incidence. She was provided with Vaccine Information Sheet and instruction to access the V-Safe system.   Carrie Flores was instructed to call 911 with any severe reactions post vaccine: Marland Kitchen Difficulty breathing  . Swelling of your face and throat  . A fast heartbeat  . A bad rash all over your body  . Dizziness and weakness    Immunizations Administered    Name Date Dose VIS Date Route   Pfizer COVID-19 Vaccine 12/29/2019  2:59 PM 0.3 mL 10/18/2019 Intramuscular   Manufacturer: [employer], [employer]   Lot: [id number]   NDC: [address]-2

## 2020-01-15 ENCOUNTER — Other Ambulatory Visit: Payer: Self-pay | Admitting: Internal Medicine

## 2020-01-15 DIAGNOSIS — R42 Dizziness and giddiness: Secondary | ICD-10-CM

## 2020-01-15 DIAGNOSIS — R519 Headache, unspecified: Secondary | ICD-10-CM

## 2020-01-22 ENCOUNTER — Ambulatory Visit: Payer: Medicare Other | Attending: Internal Medicine

## 2020-01-22 DIAGNOSIS — Z23 Encounter for immunization: Secondary | ICD-10-CM

## 2020-01-22 NOTE — Progress Notes (Signed)
   Covid-19 Vaccination Clinic  Name:  Carrie Flores    MRN: [id number] DOB: 1950-11-12  01/22/2020  [name] was observed post Covid-19 immunization for 15 minutes without incident. She was provided with Vaccine Information Sheet and instruction to access the V-Safe system.   [name] was instructed to call 911 with any severe reactions post vaccine: Marland Kitchen Difficulty breathing  . Swelling of face and throat  . A fast heartbeat  . A bad rash all over body  . Dizziness and weakness   Immunizations Administered    Name Date Dose VIS Date Route   Pfizer COVID-19 Vaccine 01/22/2020  1:35 PM 0.3 mL 10/18/2019 Intramuscular   Manufacturer: [employer], [employer]   Lot: [id number]   NDC: [address]-1

## 2020-01-27 ENCOUNTER — Ambulatory Visit
Admission: RE | Admit: 2020-01-27 | Discharge: 2020-01-27 | Disposition: A | Discharge status: Home / Self Care | Payer: Medicare Other | Source: Ambulatory Visit | Attending: Internal Medicine | Admitting: Internal Medicine

## 2020-01-27 DIAGNOSIS — R42 Dizziness and giddiness: Secondary | ICD-10-CM

## 2020-01-27 DIAGNOSIS — R519 Headache, unspecified: Secondary | ICD-10-CM

## 2020-01-27 MED ORDER — IOPAMIDOL (ISOVUE-300) INJECTION 61%
75.0000 mL | Freq: Once | INTRAVENOUS | Status: AC | PRN
  Administered 2020-01-27: 75 mL via INTRAVENOUS

## 2020-10-27 ENCOUNTER — Encounter (INDEPENDENT_AMBULATORY_CARE_PROVIDER_SITE_OTHER): Payer: Self-pay | Admitting: Otolaryngology

## 2020-10-27 ENCOUNTER — Ambulatory Visit (INDEPENDENT_AMBULATORY_CARE_PROVIDER_SITE_OTHER): Payer: Medicare Other | Admitting: Otolaryngology

## 2020-10-27 ENCOUNTER — Other Ambulatory Visit: Payer: Self-pay

## 2020-10-27 VITALS — Temp 97.3°F

## 2020-10-27 DIAGNOSIS — H9313 Tinnitus, bilateral: Secondary | ICD-10-CM | POA: Diagnosis not present

## 2020-10-27 DIAGNOSIS — H90A32 Mixed conductive and sensorineural hearing loss, unilateral, left ear with restricted hearing on the contralateral side: Secondary | ICD-10-CM | POA: Diagnosis not present

## 2020-10-27 DIAGNOSIS — H6982 Other specified disorders of Eustachian tube, left ear: Secondary | ICD-10-CM | POA: Diagnosis not present

## 2020-10-27 NOTE — Progress Notes (Signed)
HPI: Carrie Flores is a 69 y.o. female who returns today for evaluation of ear complaints.  She has had ringing in her ears for years and is generally worse on the left side.  She presently is not using nasal steroid spray.  She tried Lipo flavonoid which helps a little bit.  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  Her upper frequencies are worse than her lower frequencies..  Past Medical History:  Diagnosis Date  . Hypertension   . Hyperthyroidism   . Vertigo    No past surgical history on file. Social History   Socioeconomic History  . Marital status: Married    Spouse name: Not on file  . Number of children: Not on file  . Years of education: Not on file  . Highest education level: Not on file  Occupational History  . Not on file  Tobacco Use  . Smoking status: Never Smoker  . Smokeless tobacco: Never Used  Substance and Sexual Activity  . Alcohol use: No  . Drug use: No  . Sexual activity: Not on file  Other Topics Concern  . Not on file  Social History Narrative  . Not on file   Social Determinants of Health   Financial Resource Strain: Not on file  Food Insecurity: Not on file  Transportation Needs: Not on file  Physical Activity: Not on file  Stress: Not on file  Social Connections: Not on file   Family History  Problem Relation Age of Onset  . Breast cancer Neg Hx    No Known Allergies Prior to Admission medications   Medication Sig Start Date End Date Taking? Authorizing Provider  aspirin EC 81 MG tablet Take 81 mg by mouth daily.    [provider]  BYSTOLIC 10 MG tablet Take 20 mg by mouth every evening.  07/24/14   [provider]  cholecalciferol (VITAMIN D) 1000 units tablet Take 1,000 Units by mouth daily.    [provider]  ibuprofen (ADVIL,MOTRIN) 800 MG tablet Take 800 mg by mouth every 8 (eight)  hours as needed for moderate pain.    [provider]  levothyroxine (SYNTHROID, LEVOTHROID) 125 MCG tablet Take 125 mcg by mouth daily.    [provider]  meclizine (ANTIVERT) 25 MG tablet Take 1 tablet (25 mg total) by mouth 3 (three) times daily as needed for dizziness. 09/21/16   Gilda Crease, MD  Multiple Vitamin (MULTIVITAMIN WITH MINERALS) TABS tablet Take 1 tablet by mouth daily.    [provider]  Olmesartan-Amlodipine-HCTZ 40-5-25 MG TABS Take 1 tablet by mouth every morning.    [provider]  vitamin B-12 (CYANOCOBALAMIN) 1000 MCG tablet Take 500 mcg by mouth daily.    [provider]     Positive ROS: Otherwise negative  All other systems have been reviewed and were otherwise negative with the exception of those mentioned in the HPI and as above.  Physical Exam: Constitutional: Alert, well-appearing, no acute distress Ears: External ears without lesions or tenderness. Ear canals are clear.  Right TM is clear.  Left TM reveals a large posterior retraction pocket with questionable erosion of the long process of the incus.  The pocket is dry with no evidence of cholesteatoma and no signs of infection.  I was able to insufflate some air behind the left TM after multiple attempts.  On tuning  fork testing Weber lateralized to the left. Nasal: External nose without lesions. Septum with minimal deformity and mild rhinitis..  Nasopharyngoscopy was performed on the left side to evaluate the nasopharynx and eustachian tube.  The eustachian tube on the left side was unobstructed although the there was some mucus around the tube. Oral: Lips and gums without lesions. Tongue and palate mucosa without lesions. Posterior oropharynx clear. Neck: No palpable adenopathy or masses Respiratory: Breathing comfortably  Skin: No facial/neck lesions or rash noted.  Procedures  Assessment: Chronic rhinitis with left eustachian tube  dysfunction. 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  No signs of cholesteatoma.  Plan: Recommended regular use of Nasacort 2 sprays each nostril at night. Also suggested trying to use the "ear popper" as this may help. Recommended follow-up in 1 year for recheck.  She will return earlier if she notices increasing ear problems. Her last audiogram in June 2020 demonstrated SRT's of 35 DB on the right and 40 dB on the left.  Discussed with her that she would be a candidate for hearing aids but she is not interested in hearing aids at this point   Narda Bonds, MD

## 2022-09-08 ENCOUNTER — Other Ambulatory Visit (HOSPITAL_COMMUNITY): Payer: Self-pay | Admitting: Internal Medicine

## 2022-09-08 DIAGNOSIS — E785 Hyperlipidemia, unspecified: Secondary | ICD-10-CM

## 2022-09-13 ENCOUNTER — Ambulatory Visit (HOSPITAL_COMMUNITY)
Admission: RE | Admit: 2022-09-13 | Discharge: 2022-09-13 | Disposition: A | Discharge status: Home / Self Care | Payer: BC Managed Care – PPO | Source: Ambulatory Visit | Attending: Internal Medicine | Admitting: Internal Medicine

## 2022-09-13 DIAGNOSIS — E785 Hyperlipidemia, unspecified: Secondary | ICD-10-CM | POA: Insufficient documentation
